# Patient Record
Sex: Male | Born: 2000 | Race: Black or African American | Hispanic: No | Marital: Single | Smoking: Never smoker
Health system: Southern US, Community
[De-identification: ages and names within clinical notes are randomized; demographics above are authoritative.]

---

## 2019-01-08 ENCOUNTER — Emergency Department (HOSPITAL_COMMUNITY): Payer: BLUE CROSS/BLUE SHIELD

## 2019-01-08 ENCOUNTER — Emergency Department (HOSPITAL_COMMUNITY)
Admission: EM | Admit: 2019-01-08 | Discharge: 2019-01-08 | Disposition: A | Discharge status: Home / Self Care | Payer: BLUE CROSS/BLUE SHIELD | Attending: Emergency Medicine | Admitting: Emergency Medicine

## 2019-01-08 ENCOUNTER — Other Ambulatory Visit: Payer: Self-pay

## 2019-01-08 ENCOUNTER — Encounter (HOSPITAL_COMMUNITY): Payer: Self-pay | Admitting: Emergency Medicine

## 2019-01-08 DIAGNOSIS — R0602 Shortness of breath: Secondary | ICD-10-CM | POA: Diagnosis not present

## 2019-01-08 MED ORDER — ALBUTEROL SULFATE HFA 108 (90 BASE) MCG/ACT IN AERS
4.0000 | INHALATION_SPRAY | Freq: Once | RESPIRATORY_TRACT | Status: AC
  Administered 2019-01-08: 4 via RESPIRATORY_TRACT
  Filled 2019-01-08: qty 6.7

## 2019-01-08 MED ORDER — AEROCHAMBER PLUS FLO-VU MEDIUM MISC
1.0000 | Freq: Once | Status: DC
  Filled 2019-01-08: qty 1

## 2019-01-08 NOTE — ED Notes (Signed)
Pt given DC instructions and isolation precautions for 14 days. Pt verbalized understanding. Pt states breathing is better with inhaler. Pt advised to come back for increased SOB

## 2019-01-08 NOTE — ED Provider Notes (Signed)
[facility] EMERGENCY DEPARTMENT Provider Note   CSN: [id number] Arrival date & time: 01/08/19  1940    History   Chief Complaint Chief Complaint  Patient presents with  . Chest Pain  . Shortness of Breath    HPI Dustin Schultz is a 18 y.o. male with a past medical history of asthma, seasonal allergies, who presents today for evaluation of shortness of breath.  He reports that he was at work today when he developed sudden shortness of breath.  He reports generally feeling like his chest is tight.  He does report that someone else at work has reportedly tested positive for corona virus.  He reports that he works at [employer] in [place] [employer].  All of his symptoms started suddenly today.  He denies any hemoptysis, no hormone use leg swelling, or history of PE/DVT.  He reports sore throat, however that is been present for multiple weeks and he attributes that to the pollen.     HPI  History reviewed. No pertinent past medical history.  There are no active problems to display for this patient.   History reviewed. No pertinent surgical history.      Home Medications    Prior to Admission medications   Not on File    Family History No family history on file.  Social History Social History   Tobacco Use  . Smoking status: Not on file  Substance Use Topics  . Alcohol use: Not on file  . Drug use: Not on file     Allergies   Patient has no known allergies.   Review of Systems Review of Systems  Constitutional: Negative for chills and fever.  HENT: Positive for congestion, postnasal drip, sinus pressure and sore throat.   Eyes: Positive for redness.  Respiratory: Positive for chest tightness and shortness of breath. Negative for cough.   Gastrointestinal: Negative for abdominal pain, diarrhea, nausea and vomiting.  Genitourinary: Negative for dysuria.  Musculoskeletal: Negative for back pain and neck pain.  Neurological: Negative for weakness and  headaches.  All other systems reviewed and are negative.    Physical Exam Updated Vital Signs BP 139/85   Pulse (!) 103   Temp 99.1 F (37.3 C) (Oral)   Resp 20   Ht 6\' 1"  (1.854 m)   Wt 99.8 kg   SpO2 97%   BMI 29.03 kg/m   Physical Exam Vitals signs and nursing note reviewed.  Constitutional:      Appearance: He is well-developed.  HENT:     Head: Normocephalic and atraumatic.  Eyes:     Conjunctiva/sclera: Conjunctivae normal.  Neck:     Musculoskeletal: Neck supple.  Cardiovascular:     Rate and Rhythm: Normal rate and regular rhythm.     Heart sounds: Normal heart sounds. No murmur.  Pulmonary:     Effort: Pulmonary effort is normal. No respiratory distress.     Breath sounds: Examination of the right-upper field reveals wheezing. Examination of the left-upper field reveals wheezing. Examination of the right-middle field reveals wheezing. Examination of the left-middle field reveals wheezing. Examination of the right-lower field reveals wheezing. Examination of the left-lower field reveals wheezing. Wheezing present.  Abdominal:     Palpations: Abdomen is soft.     Tenderness: There is no abdominal tenderness.  Skin:    General: Skin is warm and dry.  Neurological:     General: No focal deficit present.     Mental Status: He is alert.  Psychiatric:  Mood and Affect: Mood normal.        Behavior: Behavior normal.      ED Treatments / Results  Labs (all labs ordered are listed, but only abnormal results are displayed) Labs Reviewed - No data to display  EKG EKG Interpretation  Date/Time:  Monday January 08 2019 20:11:30 EDT Ventricular Rate:  113 PR Interval:    QRS Duration: 88 QT Interval:  302 QTC Calculation: 414 R Axis:   64 Text Interpretation:  Sinus tachycardia Borderline Q waves in lateral leads ST elev, probable normal early repol pattern No previous ECGs available Confirmed by Alvira Monday ([id number]) on 01/08/2019 8:20:21 PM    Radiology Dg Chest Port 1 View  Result Date: 01/08/2019 CLINICAL DATA:  18 year old male with shortness of breath and tachycardia. EXAM: PORTABLE CHEST 1 VIEW COMPARISON:  None. FINDINGS: The heart size and mediastinal contours are within normal limits. Both lungs are clear. The visualized skeletal structures are unremarkable. IMPRESSION: No active disease. Electronically Signed   By: Elgie Collard M.D.   On: 01/08/2019 20:40    Procedures Procedures (including critical care time)  Medications Ordered in ED Medications  AeroChamber Plus Flo-Vu Medium MISC 1 each (has no administration in time range)  albuterol (PROVENTIL HFA;VENTOLIN HFA) 108 (90 Base) MCG/ACT inhaler 4 puff (4 puffs Inhalation Given 01/08/19 2105)     Initial Impression / Assessment and Plan / ED Course  I have reviewed the triage vital signs and the nursing notes.  Pertinent labs & imaging results that were available during my care of the patient were reviewed by me and considered in my medical decision making (see chart for details).  Clinical Course as of Jan 07 2210  Mon Jan 08, 2019  2146 Patient re-evaluated, he reports his breathing is better.     [EH]    Clinical Course User Index [EH] Cristina Gong, PA-C      Patient presents today for evaluation of shortness of breath.  He does have a known contact who has had coronavirus at work.  He has not had any fevers.  He feels like this may be allergy related.  On arrival here he was tachycardic with a temperature of 99.0.  He is satting at 97% on room air.  His lung sounds were wheezy bilaterally.  He was treated with albuterol MDI after which he reported improvement in his breathing.  Chest x-ray was obtained without evidence of acute infiltrates or abnormalities.  EKG does not show evidence of acute ischemia.  Given that his symptoms improved with albuterol I suspect that this may be more asthma/allergy related however given that he has shortness of  breath with a known coronavirus contact he is placed on droplet and contact precautions.  He is instructed to quarantine himself at home.  He states his understanding of this and the quarantine instructions and that he will abide by them.  He does not have any PE risk factors, does not take any hormones, does not have any history of PE or DVT, no leg swelling, hemoptysis, or recent prolonged travel.    Return precautions were discussed with patient who states their understanding.  At the time of discharge patient denied any unaddressed complaints or concerns.  Patient is agreeable for discharge home.   Final Clinical Impressions(s) / ED Diagnoses   Final diagnoses:  Shortness of breath    ED Discharge Orders    None       Cristina Gong, [place] 01/08/19 2239  Alvira Monday, MD 01/09/19 [phone]

## 2019-01-08 NOTE — Discharge Instructions (Addendum)
To use your inhaler take two puffs every 4 hours as needed.  You may use it every hour if needed.  If this does not help, your symptoms worsen or your lips or finger nails turn blue please seek additional medical care.  You must quarentine your self at home.  You must remain quarantined for a minimum of 7 days.  After that you may stop quarantining once you have not had any symptoms and have not had fevers for at least 72 hours.  If you are able to stay quarantined for 2 weeks that would be ideal.  I would also recommend that you use Flonase another nasal steroid spray, in addition to taking either Allegra, Claritin, or Zyrtec to help with your allergy symptoms.   Person Under Monitoring Name: Dustin Schultz  Location: [address] [place] [address]   Infection Prevention Recommendations for Individuals Confirmed to have, or Being Evaluated for, 2019 Novel Coronavirus (COVID-19) Infection Who Receive Care at Home  Individuals who are confirmed to have, or are being evaluated for, COVID-19 should follow the prevention steps below until a healthcare provider or local or state health department says they can return to normal activities.  Stay home except to get medical care You should restrict activities outside your home, except for getting medical care. Do not go to work, school, or public areas, and do not use public transportation or taxis.  Call ahead before visiting your doctor Before your medical appointment, call the healthcare provider and tell them that you have, or are being evaluated for, COVID-19 infection. This will help the healthcare providers office take steps to keep other people from getting infected. Ask your healthcare provider to call the local or state health department.  Monitor your symptoms Seek prompt medical attention if your illness is worsening (e.g., difficulty breathing). Before going to your medical appointment, call the healthcare provider and tell  them that you have, or are being evaluated for, COVID-19 infection. Ask your healthcare provider to call the local or state health department.  Wear a facemask You should wear a facemask that covers your nose and mouth when you are in the same room with other people and when you visit a healthcare provider. People who live with or visit you should also wear a facemask while they are in the same room with you.  Separate yourself from other people in your home As much as possible, you should stay in a different room from other people in your home. Also, you should use a separate bathroom, if available.  Avoid sharing household items You should not share dishes, drinking glasses, cups, eating utensils, towels, bedding, or other items with other people in your home. After using these items, you should wash them thoroughly with soap and water.  Cover your coughs and sneezes Cover your mouth and nose with a tissue when you cough or sneeze, or you can cough or sneeze into your sleeve. Throw used tissues in a lined trash can, and immediately wash your hands with soap and water for at least 20 seconds or use an alcohol-based hand rub.  Wash your [employer] your hands often and thoroughly with soap and water for at least 20 seconds. You can use an alcohol-based hand sanitizer if soap and water are not available and if your hands are not visibly dirty. Avoid touching your eyes, nose, and mouth with unwashed hands.   Prevention Steps for Caregivers and Household Members of Individuals Confirmed to have, or  Being Evaluated for, COVID-19 Infection Being Cared for in the Home  If you live with, or provide care at home for, a person confirmed to have, or being evaluated for, COVID-19 infection please follow these guidelines to prevent infection:  Follow healthcare providers instructions Make sure that you understand and can help the patient follow any healthcare provider instructions for all  care.  Provide for the patients basic needs You should help the patient with basic needs in the home and provide support for getting groceries, prescriptions, and other personal needs.  Monitor the patients symptoms If they are getting sicker, call his or her medical provider and tell them that the patient has, or is being evaluated for, COVID-19 infection. This will help the healthcare providers office take steps to keep other people from getting infected. Ask the healthcare provider to call the local or state health department.  Limit the number of people who have contact with the patient If possible, have only one caregiver for the patient. Other household members should stay in another home or place of residence. If this is not possible, they should stay in another room, or be separated from the patient as much as possible. Use a separate bathroom, if available. Restrict visitors who do not have an essential need to be in the home.  Keep older adults, very young children, and other sick people away from the patient Keep older adults, very young children, and those who have compromised immune systems or chronic health conditions away from the patient. This includes people with chronic heart, lung, or kidney conditions, diabetes, and cancer.  Ensure good ventilation Make sure that shared spaces in the home have good air flow, such as from an air conditioner or an opened window, weather permitting.  Wash your hands often Wash your hands often and thoroughly with soap and water for at least 20 seconds. You can use an alcohol based hand sanitizer if soap and water are not available and if your hands are not visibly dirty. Avoid touching your eyes, nose, and mouth with unwashed hands. Use disposable paper towels to dry your hands. If not available, use dedicated cloth towels and replace them when they become wet.  Wear a facemask and gloves Wear a disposable facemask at all times in  the room and gloves when you touch or have contact with the patients blood, body fluids, and/or secretions or excretions, such as sweat, saliva, sputum, nasal mucus, vomit, urine, or feces.  Ensure the mask fits over your nose and mouth tightly, and do not touch it during use. Throw out disposable facemasks and gloves after using them. Do not reuse. Wash your hands immediately after removing your facemask and gloves. If your personal clothing becomes contaminated, carefully remove clothing and launder. Wash your hands after handling contaminated clothing. Place all used disposable facemasks, gloves, and other waste in a lined container before disposing them with other household waste. Remove gloves and wash your hands immediately after handling these items.  Do not share dishes, glasses, or other household items with the patient Avoid sharing household items. You should not share dishes, drinking glasses, cups, eating utensils, towels, bedding, or other items with a patient who is confirmed to have, or being evaluated for, COVID-19 infection. After the person uses these items, you should wash them thoroughly with soap and water.  Wash laundry thoroughly Immediately remove and wash clothes or bedding that have blood, body fluids, and/or secretions or excretions, such as sweat, saliva, sputum, nasal mucus,  vomit, urine, or feces, on them. Wear gloves when handling laundry from the patient. Read and follow directions on labels of laundry or clothing items and detergent. In general, wash and dry with the warmest temperatures recommended on the label.  Clean all areas the individual has used often Clean all touchable surfaces, such as counters, tabletops, doorknobs, bathroom fixtures, toilets, phones, keyboards, tablets, and bedside tables, every day. Also, clean any surfaces that may have blood, body fluids, and/or secretions or excretions on them. Wear gloves when cleaning surfaces the patient has  come in contact with. Use a diluted bleach solution (e.g., dilute bleach with 1 part bleach and 10 parts water) or a household disinfectant with a label that says EPA-registered for coronaviruses. To make a bleach solution at home, add 1 tablespoon of bleach to 1 quart (4 cups) of water. For a larger supply, add  cup of bleach to 1 gallon (16 cups) of water. Read labels of cleaning products and follow recommendations provided on product labels. Labels contain instructions for safe and effective use of the cleaning product including precautions you should take when applying the product, such as wearing gloves or eye protection and making sure you have good ventilation during use of the product. Remove gloves and wash hands immediately after cleaning.  Monitor yourself for signs and symptoms of illness Caregivers and household members are considered close contacts, should monitor their health, and will be asked to limit movement outside of the home to the extent possible. Follow the monitoring steps for close contacts listed on the symptom monitoring form.   ? If you have additional questions, contact your local health department or call the epidemiologist on call at [phone] (available 24/7). ? This guidance is subject to change. For the most up-to-date guidance from [facility], please refer to their website: [employer]

## 2019-01-08 NOTE — ED Triage Notes (Signed)
Pt states he has been SOB today and having congestion. States he thought it was allergies but SOB has gotten worse. Denies travel. Works at [employer] in [place] [employer]. Hx of asthma

## 2022-03-02 ENCOUNTER — Emergency Department (HOSPITAL_COMMUNITY): Payer: BLUE CROSS/BLUE SHIELD | Admitting: Anesthesiology

## 2022-03-02 ENCOUNTER — Ambulatory Visit (HOSPITAL_COMMUNITY)
Admission: EM | Admit: 2022-03-02 | Discharge: 2022-03-03 | Disposition: A | Discharge status: Home / Self Care | Payer: BLUE CROSS/BLUE SHIELD | Attending: Student | Admitting: Student

## 2022-03-02 ENCOUNTER — Encounter (HOSPITAL_COMMUNITY): Payer: Self-pay | Admitting: Emergency Medicine

## 2022-03-02 ENCOUNTER — Other Ambulatory Visit: Payer: Self-pay

## 2022-03-02 ENCOUNTER — Emergency Department (HOSPITAL_COMMUNITY): Payer: BLUE CROSS/BLUE SHIELD

## 2022-03-02 ENCOUNTER — Encounter (HOSPITAL_COMMUNITY)
Admission: EM | Disposition: A | Discharge status: Home / Self Care | Payer: Self-pay | Source: Home / Self Care | Attending: Student

## 2022-03-02 DIAGNOSIS — X58XXXA Exposure to other specified factors, initial encounter: Secondary | ICD-10-CM | POA: Insufficient documentation

## 2022-03-02 DIAGNOSIS — S02651A Fracture of angle of right mandible, initial encounter for closed fracture: Secondary | ICD-10-CM

## 2022-03-02 DIAGNOSIS — Y9367 Activity, basketball: Secondary | ICD-10-CM | POA: Diagnosis not present

## 2022-03-02 HISTORY — PX: ORIF MANDIBULAR FRACTURE: SHX2127

## 2022-03-02 LAB — BASIC METABOLIC PANEL WITH GFR
Anion gap: 11 (ref 5–15)
BUN: 8 mg/dL (ref 6–20)
CO2: 26 mmol/L (ref 22–32)
Calcium: 9.8 mg/dL (ref 8.9–10.3)
Chloride: 103 mmol/L (ref 98–111)
Creatinine, Ser: 0.96 mg/dL (ref 0.61–1.24)
GFR, Estimated: >60 mL/min (ref >60)
Glucose, Bld: 77 mg/dL (ref 70–99)
Potassium: 4 mmol/L (ref 3.5–5.1)
Sodium: 140 mmol/L (ref 135–145)

## 2022-03-02 LAB — CBC WITH DIFFERENTIAL/PLATELET
Abs Immature Granulocytes: 0.01 10*3/uL (ref 0.00–0.07)
Basophils Absolute: 0 10*3/uL (ref 0.0–0.1)
Basophils Relative: 1 %
Eosinophils Absolute: 0.2 10*3/uL (ref 0.0–0.5)
Eosinophils Relative: 4 %
HCT: 46.8 % (ref 39.0–52.0)
Hemoglobin: 15.5 g/dL (ref 13.0–17.0)
Immature Granulocytes: 0 %
Lymphocytes Relative: 28 %
Lymphs Abs: 1.4 10*3/uL (ref 0.7–4.0)
MCH: 26.6 pg (ref 26.0–34.0)
MCHC: 33.1 g/dL (ref 30.0–36.0)
MCV: 80.4 fL (ref 80.0–100.0)
Monocytes Absolute: 0.5 10*3/uL (ref 0.1–1.0)
Monocytes Relative: 11 %
Neutro Abs: 2.8 10*3/uL (ref 1.7–7.7)
Neutrophils Relative %: 56 %
Platelets: 247 10*3/uL (ref 150–400)
RBC: 5.82 MIL/uL — ABNORMAL HIGH (ref 4.22–5.81)
RDW: 12.2 % (ref 11.5–15.5)
WBC: 5.1 10*3/uL (ref 4.0–10.5)
nRBC: 0 % (ref 0.0–0.2)

## 2022-03-02 SURGERY — OPEN REDUCTION INTERNAL FIXATION (ORIF) MANDIBULAR FRACTURE
Anesthesia: General | Site: Mouth

## 2022-03-02 MED ORDER — ACETAMINOPHEN 10 MG/ML IV SOLN
INTRAVENOUS | Status: AC
Start: 2022-03-02 — End: ?
  Filled 2022-03-02: qty 100

## 2022-03-02 MED ORDER — MIDAZOLAM HCL 5 MG/5ML IJ SOLN
INTRAMUSCULAR | Status: DC | PRN
  Administered 2022-03-02: 2 mg via INTRAVENOUS

## 2022-03-02 MED ORDER — ONDANSETRON HCL 4 MG/2ML IJ SOLN
4.0000 mg | Freq: Once | INTRAMUSCULAR | Status: AC
  Administered 2022-03-02: 4 mg via INTRAVENOUS
  Filled 2022-03-02: qty 2

## 2022-03-02 MED ORDER — DEXAMETHASONE SODIUM PHOSPHATE 4 MG/ML IJ SOLN
INTRAMUSCULAR | Status: DC | PRN
  Administered 2022-03-02: 10 mg via INTRAVENOUS

## 2022-03-02 MED ORDER — CEFAZOLIN SODIUM-DEXTROSE 2-4 GM/100ML-% IV SOLN
INTRAVENOUS | Status: AC
  Filled 2022-03-02: qty 100

## 2022-03-02 MED ORDER — CEFAZOLIN SODIUM-DEXTROSE 2-3 GM-%(50ML) IV SOLR
INTRAVENOUS | Status: DC | PRN
Start: 2022-03-02 — End: 2022-03-03
  Administered 2022-03-02: 2 g via INTRAVENOUS

## 2022-03-02 MED ORDER — LIDOCAINE-EPINEPHRINE 1 %-1:100000 IJ SOLN
INTRAMUSCULAR | Status: AC
  Filled 2022-03-02: qty 1

## 2022-03-02 MED ORDER — LIDOCAINE-EPINEPHRINE 1 %-1:100000 IJ SOLN
INTRAMUSCULAR | Status: DC | PRN
  Administered 2022-03-02: 3 mL

## 2022-03-02 MED ORDER — 0.9 % SODIUM CHLORIDE (POUR BTL) OPTIME
TOPICAL | Status: DC | PRN
Start: 2022-03-02 — End: 2022-03-03
  Administered 2022-03-02: 1000 mL

## 2022-03-02 MED ORDER — FENTANYL CITRATE (PF) 250 MCG/5ML IJ SOLN
INTRAMUSCULAR | Status: AC
  Filled 2022-03-02: qty 5

## 2022-03-02 MED ORDER — ACETAMINOPHEN 10 MG/ML IV SOLN
INTRAVENOUS | Status: DC | PRN
  Administered 2022-03-02: 1000 mg via INTRAVENOUS

## 2022-03-02 MED ORDER — ONDANSETRON HCL 4 MG/2ML IJ SOLN
INTRAMUSCULAR | Status: DC | PRN
  Administered 2022-03-02: 4 mg via INTRAVENOUS

## 2022-03-02 MED ORDER — ARTIFICIAL TEARS OPHTHALMIC OINT
TOPICAL_OINTMENT | OPHTHALMIC | Status: DC | PRN
  Administered 2022-03-02: 1 via OPHTHALMIC

## 2022-03-02 MED ORDER — FENTANYL CITRATE (PF) 250 MCG/5ML IJ SOLN
INTRAMUSCULAR | Status: DC | PRN
  Administered 2022-03-02 (×3): 50 ug via INTRAVENOUS
  Administered 2022-03-02: 100 ug via INTRAVENOUS

## 2022-03-02 MED ORDER — ROCURONIUM BROMIDE 100 MG/10ML IV SOLN
INTRAVENOUS | Status: DC | PRN
  Administered 2022-03-02: 80 mg via INTRAVENOUS

## 2022-03-02 MED ORDER — MIDAZOLAM HCL 2 MG/2ML IJ SOLN
INTRAMUSCULAR | Status: AC
  Filled 2022-03-02: qty 2

## 2022-03-02 MED ORDER — OXYMETAZOLINE HCL 0.05 % NA SOLN
NASAL | Status: AC
  Filled 2022-03-02: qty 30

## 2022-03-02 MED ORDER — LACTATED RINGERS IV SOLN: INTRAVENOUS | Status: DC | PRN

## 2022-03-02 MED ORDER — LIDOCAINE HCL (CARDIAC) PF 100 MG/5ML IV SOSY
PREFILLED_SYRINGE | INTRAVENOUS | Status: DC | PRN
  Administered 2022-03-02: 40 mg via INTRATRACHEAL

## 2022-03-02 MED ORDER — PROPOFOL 10 MG/ML IV BOLUS
INTRAVENOUS | Status: AC
  Filled 2022-03-02: qty 20

## 2022-03-02 MED ORDER — OXYMETAZOLINE HCL 0.05 % NA SOLN
NASAL | Status: DC | PRN
  Administered 2022-03-02: 1 via NASAL

## 2022-03-02 MED ORDER — PROPOFOL 10 MG/ML IV BOLUS
INTRAVENOUS | Status: DC | PRN
  Administered 2022-03-02: 200 mg via INTRAVENOUS

## 2022-03-02 MED ORDER — SODIUM CHLORIDE 0.9 % IV BOLUS
1000.0000 mL | Freq: Once | INTRAVENOUS | Status: AC
  Administered 2022-03-02: 1000 mL via INTRAVENOUS

## 2022-03-02 MED ORDER — MORPHINE SULFATE (PF) 4 MG/ML IV SOLN
4.0000 mg | Freq: Once | INTRAVENOUS | Status: AC
  Administered 2022-03-02: 4 mg via INTRAVENOUS
  Filled 2022-03-02: qty 1

## 2022-03-02 SURGICAL SUPPLY — 45 items
BAG COUNTER SPONGE SURGICOUNT (BAG) ×1 IMPLANT
BAR FIX PREFORMED OMNIMAX (Miscellaneous) ×2 IMPLANT
BLADE SURG 10 STRL SS (BLADE) IMPLANT
BLADE SURG 15 STRL LF DISP TIS (BLADE) IMPLANT
BLADE SURG 15 STRL SS (BLADE)
CANISTER SUCT 3000ML PPV (MISCELLANEOUS) ×2 IMPLANT
CLEANER TIP ELECTROSURG 2X2 (MISCELLANEOUS) ×2 IMPLANT
COVER SURGICAL LIGHT HANDLE (MISCELLANEOUS) ×3 IMPLANT
DECANTER SPIKE VIAL GLASS SM (MISCELLANEOUS) ×1 IMPLANT
DRAPE HALF SHEET 40X57 (DRAPES) IMPLANT
ELECT COATED BLADE 2.86 ST (ELECTRODE) IMPLANT
ELECT NDL BLADE 2-5/6 (NEEDLE) IMPLANT
ELECT NEEDLE BLADE 2-5/6 (NEEDLE) IMPLANT
ELECT REM PT RETURN 9FT ADLT (ELECTROSURGICAL) ×2
ELECTRODE REM PT RTRN 9FT ADLT (ELECTROSURGICAL) ×1 IMPLANT
GLOVE ECLIPSE 7.5 STRL STRAW (GLOVE) ×2 IMPLANT
GOWN STRL REUS W/ TWL LRG LVL3 (GOWN DISPOSABLE) ×2 IMPLANT
GOWN STRL REUS W/TWL LRG LVL3 (GOWN DISPOSABLE) ×2
KIT BASIN OR (CUSTOM PROCEDURE TRAY) ×2 IMPLANT
KIT TURNOVER KIT B (KITS) ×2 IMPLANT
NDL HYPO 25GX1X1/2 BEV (NEEDLE) IMPLANT
NEEDLE HYPO 25GX1X1/2 BEV (NEEDLE) IMPLANT
NS IRRIG 1000ML POUR BTL (IV SOLUTION) ×2 IMPLANT
PAD ARMBOARD 7.5X6 YLW CONV (MISCELLANEOUS) ×4 IMPLANT
PATTIES SURGICAL .5 X3 (DISPOSABLE) IMPLANT
PENCIL FOOT CONTROL (ELECTRODE) ×2 IMPLANT
PLATE LOCK STRT 4H 1/SCREW (Plate) ×1 IMPLANT
POSITIONER HEAD DONUT 9IN (MISCELLANEOUS) IMPLANT
PROTECTOR CORNEAL (OPHTHALMIC RELATED) IMPLANT
SCISSORS WIRE ANG 4 3/4 DISP (INSTRUMENTS) IMPLANT
SCREW BONE 2X7 CROSS DRIVE (Screw) ×5 IMPLANT
SCREW BONE MANDIB SD 2X9 (Screw) ×5 IMPLANT
SCREW NON LOCK X-DR 2.0X5 (Screw) ×4 IMPLANT
SUT CHROMIC 3 0 PS 2 (SUTURE) ×2 IMPLANT
SUT CHROMIC 4 0 PS 2 18 (SUTURE) ×1 IMPLANT
SUT ETHILON 5 0 P 3 18 (SUTURE) ×1
SUT NYLON ETHILON 5-0 P-3 1X18 (SUTURE) ×1 IMPLANT
SUT SILK 2 0 PERMA HAND 18 BK (SUTURE) IMPLANT
SUT STEEL 0 (SUTURE)
SUT STEEL 0 18XMFL TIE 17 (SUTURE) IMPLANT
SUT STEEL 4 (SUTURE) ×1 IMPLANT
TOWEL GREEN STERILE FF (TOWEL DISPOSABLE) ×2 IMPLANT
TRAY ENT MC OR (CUSTOM PROCEDURE TRAY) ×2 IMPLANT
WATER STERILE IRR 1000ML POUR (IV SOLUTION) ×1 IMPLANT
WIRE 24 GAUGE OMINIMAX MMF (WIRE) ×5 IMPLANT

## 2022-03-02 NOTE — ED Provider Triage Note (Signed)
Emergency Medicine Provider Triage Evaluation Note  Dustin Schultz , a 21 y.o. male  was evaluated in triage.  Pt complains of mandible fracture.  Sustained a mandible fracture while playing basketball on 02/27/2022.  Went to the urgent care was diagnosed with this fracture and then saw the dentist yesterday.  Dentist referred him to the ER for treatment of a mandible fracture.  States that his pain has been controlled with pain medications.  Denies any other injuries  Review of Systems  Positive: Right jaw pain Negative: Fever  Physical Exam  BP 129/84 (BP Location: Left Arm)   Pulse 70   Temp 98.7 F (37.1 C) (Oral)   Resp 18   SpO2 99%  Gen:   Awake, no distress   Resp:  Normal effort  MSK:   Moves extremities without difficulty  Other:  No swelling noted of the face  Medical Decision Making  Medically screening exam initiated at 12:18 PM.  Appropriate orders placed.  Seamus Ivie was informed that the remainder of the evaluation will be completed by another provider, this initial triage assessment does not replace that evaluation, and the importance of remaining in the ED until their evaluation is complete.     Delia Heady, PA-C 03/02/22 1219

## 2022-03-02 NOTE — ED Notes (Signed)
Pt placed back into system. Pt did not hear name.

## 2022-03-02 NOTE — ED Provider Notes (Signed)
[facility] EMERGENCY DEPARTMENT Provider Note   CSN: [id number] Arrival date & time: 03/02/22  1115     History  Chief Complaint  Patient presents with   Jaw Pain    Dustin Schultz is a 21 y.o. male no chronic medical problems here for evaluation of jaw pain.  He was playing basketball about 4 days ago when he was elbowed to the right jaw.  He felt immediate pain since.  He was seen by urgent care and told to follow-up with dentistry.  Was seen by dentistry in [place].  Had x-ray which showed possible mandible fracture.  He was sent here for intervention.  Patient states he has not been able to eat any significant amounts since him onset.  Had noted some right-sided facial swelling.  No fever, redness or warmth.  He has never had anything like this previously.  Taking Tylenol at home without relief.  No loose dentition.  No intraoral lacerations, abrasions.  Denies headache, LOC or anticoagulation  HPI     Home Medications Prior to Admission medications   Not on File      Allergies    Patient has no known allergies.    Review of Systems   Review of Systems  Constitutional: Negative.   HENT:  Positive for facial swelling.        Right jaw pain  Respiratory: Negative.    Cardiovascular: Negative.   Gastrointestinal: Negative.   Genitourinary: Negative.   Musculoskeletal: Negative.   Skin: Negative.   Neurological: Negative.   All other systems reviewed and are negative.  Physical Exam Updated Vital Signs BP (!) 147/83   Pulse 64   Temp 98.6 F (37 C) (Oral)   Resp 20   SpO2 100%  Physical Exam Vitals and nursing note reviewed.  Constitutional:      General: He is not in acute distress.    Appearance: He is well-developed. He is not ill-appearing, toxic-appearing or diaphoretic.  HENT:     Head: Normocephalic.     Jaw: Tenderness and pain on movement present. No swelling or malocclusion.      Comments: Right-sided jaw tenderness at mandibular  angle with some mild overlying soft tissue swelling.  No erythema, warmth.  No submandibular swelling.  3 finger opening    Mouth/Throat:     Lips: Pink.     Mouth: Mucous membranes are moist. Injury present.     Tongue: No lesions. Tongue does not deviate from midline.     Pharynx: Oropharynx is clear. Uvula midline.     Tonsils: No tonsillar exudate or tonsillar abscesses.     Comments: Tongue midline, no sublingual swelling, no loose dentition.  No intraoral lacerations. No racoon eyes, battle sign Eyes:     Pupils: Pupils are equal, round, and reactive to light.     Comments: PERRLA  Neck:     Trachea: Trachea and phonation normal.     Comments: No neck stiffness or neck rigidity Cardiovascular:     Rate and Rhythm: Normal rate and regular rhythm.  Pulmonary:     Effort: Pulmonary effort is normal. No respiratory distress.  Abdominal:     General: There is no distension.     Palpations: Abdomen is soft.  Musculoskeletal:        General: Normal range of motion.     Cervical back: Full passive range of motion without pain, normal range of motion and neck supple.  Lymphadenopathy:     Cervical: No  cervical adenopathy.  Skin:    General: Skin is warm and dry.  Neurological:     General: No focal deficit present.     Mental Status: He is alert and oriented to person, place, and time.    ED Results / Procedures / Treatments   Labs (all labs ordered are listed, but only abnormal results are displayed) Labs Reviewed  CBC WITH DIFFERENTIAL/PLATELET - Abnormal; Notable for the following components:      Result Value   RBC 5.82 (*)    All other components within normal limits  BASIC METABOLIC PANEL    EKG None  Radiology CT Maxillofacial Wo Contrast  Result Date: 03/02/2022 CLINICAL DATA:  Facial trauma, blunt right jaw pain x 4 days after injury. EXAM: CT MAXILLOFACIAL WITHOUT CONTRAST TECHNIQUE: Multidetector CT imaging of the maxillofacial structures was performed.  Multiplanar CT image reconstructions were also generated. RADIATION DOSE REDUCTION: This exam was performed according to the departmental dose-optimization program which includes automated exposure control, adjustment of the mA and/or kV according to patient size and/or use of iterative reconstruction technique. COMPARISON:  None Available. FINDINGS: Osseous: There is an acute minimally displaced fracture of the angle of the right mandible distal to the mandibular foramen with the fracture plane extending through the inferior alveolar canal, best seen on axial image # 25/4 and sagittal image # 28/8, as well ex extending through the root of tooth # 31. Fracture fragments are in anatomic alignment. No other fracture identified. No mandibular dislocation. Orbits: Negative. No traumatic or inflammatory finding. Sinuses: Clear. Soft tissues: There is soft tissue swelling superficial to the body of the right mandible. Limited intracranial: No significant or unexpected finding. IMPRESSION: Minimally displaced anatomic aligned fracture of the angle of the right mandible distal to the mandibular foramen with the fracture plane traversing the inferior alveolar canal and the root of tooth # 31. No superimposed mandibular dislocation. Mild overlying soft tissue swelling. Electronically Signed   By: Helyn Numbers M.D.   On: 03/02/2022 21:26    Procedures Procedures    Medications Ordered in ED Medications  morphine (PF) 4 MG/ML injection 4 mg (4 mg Intravenous Given 03/02/22 2009)  ondansetron (ZOFRAN) injection 4 mg (4 mg Intravenous Given 03/02/22 2005)  sodium chloride 0.9 % bolus 1,000 mL (0 mLs Intravenous Stopped 03/02/22 2223)   ED Course/ Medical Decision Making/ A&P    Otherwise healthy 21 year old here for evaluation of right jaw pain after being elbowed in the face while playing basketball few days ago.  Initially seen at urgent care told follow-up with dentistry.  Was seen by dentistry today who obtained  an x-ray which showed possible right mandible fracture.  He has no loose dentition on exam.  No evidence of intraoral lacerations.  Does have some tenderness to right mandibular angle and overlying soft tissue swelling.  Sublingual and submandibular area soft, low suspicion for infectious process such as Ludewig's angina, deep space infection.  Suspect fracture.  Will obtain CT imaging here  Labs and imaging personally viewed and interpreted: CBC without leukocytosis, hemoglobin 15.5 Metabolic panel without significant abnormality CT max face with right mandibular angle fracture extending into the root of tooth #31  Patient reassessed.  Pain controlled.  Discussed labs and imaging with patient.  Will consult with ENT for further evaluation  CONSULT with Dr. Jearld Fenton with ENT who will review imaging and assess patient.  Prior to has assessed patient at bedside.  Patient to go to the OR with ENT  The patient appears reasonably stabilized for admission considering the current resources, flow, and capabilities available in the ED at this time, and [facility] doubt [facility] further screening and/or treatment in the ED prior to admission.                            Medical Decision Making Amount and/or Complexity of Data Reviewed External Data Reviewed: radiology and notes.    Details: Reviewed notes, imaging patient brought with him from dentistry Labs: ordered. Decision-making details documented in ED Course. Radiology: ordered and independent interpretation performed. Decision-making details documented in ED Course.  Risk OTC drugs. Prescription drug management. Parenteral controlled substances. Decision regarding hospitalization. Diagnosis or treatment significantly limited by social determinants of health.          Final Clinical Impression(s) / ED Diagnoses Final diagnoses:  Closed fracture of right mandibular angle, initial encounter [facility](HCC)    Rx / DC Orders ED  Discharge Orders     None         Eris Breck A, PA-C 03/02/22 2226    Glendora ScoreKommor, [place], MD 03/03/22 1610
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
## 2022-03-02 NOTE — ED Notes (Signed)
OR consent at bedside, pt removing clothing at this time
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
## 2022-03-02 NOTE — ED Notes (Signed)
NA x3

## 2022-03-02 NOTE — Anesthesia Procedure Notes (Signed)
Procedure Name: Intubation Date/Time: 03/02/2022 11:34 PM Performed by: Edmonia Caprio, CRNA Pre-anesthesia Checklist: Patient identified, Emergency Drugs available, Suction available, Patient being monitored and Timeout performed Patient Re-evaluated:Patient Re-evaluated prior to induction Oxygen Delivery Method: Circle system utilized Preoxygenation: Pre-oxygenation with 100% oxygen Induction Type: IV induction Ventilation: Mask ventilation without difficulty Laryngoscope Size: Miller and 2 Grade View: Grade I Nasal Tubes: Left and Nasal prep performed Tube size: 7.5 mm Number of attempts: 1 Placement Confirmation: ETT inserted through vocal cords under direct vision, breath sounds checked- equal and bilateral and positive ETCO2 Tube secured with: Tape Dental Injury: Teeth and Oropharynx as per pre-operative assessment  Comments: Minimal bleeding from nose into oropharynx after passing ETT through left nare.  Passed thru cords under DL with Hyacinth Meeker 2 without need for McGill's.

## 2022-03-02 NOTE — Anesthesia Preprocedure Evaluation (Signed)
Anesthesia Evaluation  Patient identified by MRN, date of birth, ID band Patient awake    History of Anesthesia Complications Negative for: history of anesthetic complications  Airway Mallampati: IV  TM Distance: >3 FB Neck ROM: Full  Mouth opening: Limited Mouth Opening  Dental  (+) Teeth Intact, Dental Advisory Given   Pulmonary neg pulmonary ROS,    breath sounds clear to auscultation       Cardiovascular negative cardio ROS   Rhythm:Regular     Neuro/Psych negative neurological ROS  negative psych ROS   GI/Hepatic negative GI ROS, Neg liver ROS,   Endo/Other  negative endocrine ROS  Renal/GU negative Renal ROS     Musculoskeletal negative musculoskeletal ROS (+)   Abdominal   Peds  Hematology negative hematology ROS (+)   Anesthesia Other Findings   Reproductive/Obstetrics                            Anesthesia Physical Anesthesia Plan  ASA: 1  Anesthesia Plan: General   Post-op Pain Management:    Induction: Intravenous  PONV Risk Score and Plan: 2 and Ondansetron and Dexamethasone  Airway Management Planned: Nasal ETT  Additional Equipment: None  Intra-op Plan:   Post-operative Plan: Extubation in OR  Informed Consent: I have reviewed the patients History and Physical, chart, labs and discussed the procedure including the risks, benefits and alternatives for the proposed anesthesia with the patient or authorized representative who has indicated his/her understanding and acceptance.     Dental advisory given  Plan Discussed with: CRNA  Anesthesia Plan Comments:         Anesthesia Quick Evaluation

## 2022-03-03 ENCOUNTER — Encounter (HOSPITAL_COMMUNITY): Payer: Self-pay | Admitting: Otolaryngology

## 2022-03-03 ENCOUNTER — Other Ambulatory Visit: Payer: Self-pay

## 2022-03-03 DIAGNOSIS — S02651A Fracture of angle of right mandible, initial encounter for closed fracture: Secondary | ICD-10-CM | POA: Diagnosis not present

## 2022-03-03 MED ORDER — ACETAMINOPHEN 160 MG/5ML PO SOLN: 1000.0000 mg | Freq: Once | ORAL | Status: DC | PRN

## 2022-03-03 MED ORDER — LIDOCAINE 2% (20 MG/ML) 5 ML SYRINGE
INTRAMUSCULAR | Status: AC
  Filled 2022-03-03: qty 5

## 2022-03-03 MED ORDER — ONDANSETRON HCL 4 MG/2ML IJ SOLN
INTRAMUSCULAR | Status: AC
  Filled 2022-03-03: qty 2

## 2022-03-03 MED ORDER — HYDROCODONE-ACETAMINOPHEN 7.5-325 MG/15ML PO SOLN: 15.0000 mL | Freq: Four times a day (QID) | ORAL | 0 refills | Status: AC | PRN

## 2022-03-03 MED ORDER — KETOROLAC TROMETHAMINE 30 MG/ML IJ SOLN
INTRAMUSCULAR | Status: AC
  Filled 2022-03-03: qty 1

## 2022-03-03 MED ORDER — KETOROLAC TROMETHAMINE 30 MG/ML IJ SOLN
INTRAMUSCULAR | Status: DC | PRN
  Administered 2022-03-03: 30 mg via INTRAVENOUS

## 2022-03-03 MED ORDER — OXYCODONE HCL 5 MG PO TABS: 5.0000 mg | ORAL_TABLET | Freq: Once | ORAL | Status: DC | PRN

## 2022-03-03 MED ORDER — ACETAMINOPHEN 500 MG PO TABS: 1000.0000 mg | ORAL_TABLET | Freq: Once | ORAL | Status: DC | PRN

## 2022-03-03 MED ORDER — ROCURONIUM BROMIDE 10 MG/ML (PF) SYRINGE
PREFILLED_SYRINGE | INTRAVENOUS | Status: AC
  Filled 2022-03-03: qty 10

## 2022-03-03 MED ORDER — OXYCODONE HCL 5 MG/5ML PO SOLN: 5.0000 mg | Freq: Once | ORAL | Status: DC | PRN

## 2022-03-03 MED ORDER — DEXAMETHASONE SODIUM PHOSPHATE 10 MG/ML IJ SOLN
INTRAMUSCULAR | Status: AC
  Filled 2022-03-03: qty 1

## 2022-03-03 MED ORDER — FENTANYL CITRATE (PF) 100 MCG/2ML IJ SOLN
INTRAMUSCULAR | Status: AC
  Filled 2022-03-03: qty 2

## 2022-03-03 MED ORDER — SUGAMMADEX SODIUM 200 MG/2ML IV SOLN
INTRAVENOUS | Status: DC | PRN
  Administered 2022-03-03: 200 mg via INTRAVENOUS

## 2022-03-03 MED ORDER — FENTANYL CITRATE (PF) 100 MCG/2ML IJ SOLN
25.0000 ug | INTRAMUSCULAR | Status: DC | PRN
  Administered 2022-03-03 (×2): 50 ug via INTRAVENOUS

## 2022-03-03 MED ORDER — ACETAMINOPHEN 10 MG/ML IV SOLN: 1000.0000 mg | Freq: Once | INTRAVENOUS | Status: DC | PRN
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
## 2022-03-03 NOTE — Transfer of Care (Signed)
Immediate Anesthesia Transfer of Care Note  Patient: Dustin Schultz  Procedure(s) Performed: OPEN REDUCTION INTERNAL FIXATION (ORIF) MANDIBULAR FRACTURE  Patient Location: PACU  Anesthesia Type:General  Level of Consciousness: awake and responds to stimulation  Airway & Oxygen Therapy: Patient Spontanous Breathing  Post-op Assessment: Report given to RN, Post -op Vital signs reviewed and stable and Patient moving all extremities X 4  Post vital signs: Reviewed and stable  Last Vitals:  Vitals Value Taken Time  BP 156/88 03/03/22 0047  Temp    Pulse 86 03/03/22 0049  Resp 12 03/03/22 0049  SpO2 98 % 03/03/22 0049  Vitals shown include unvalidated device data.  Last Pain:  Vitals:   03/02/22 2223  TempSrc:   PainSc: 5          Complications: No notable events documented.

## 2022-03-04 ENCOUNTER — Encounter (HOSPITAL_COMMUNITY): Payer: Self-pay | Admitting: Otolaryngology

## 2022-03-04 NOTE — Anesthesia Postprocedure Evaluation (Signed)
Anesthesia Post Note  Patient: Jaylee Lantry  Procedure(s) Performed: OPEN REDUCTION INTERNAL FIXATION (ORIF)  OF MANDIBLE FRACTURE WITH  MAXILLARY MANDIBULAR FIXATION (Mouth)     Patient location during evaluation: PACU Anesthesia Type: General Level of consciousness: awake and alert Pain management: pain level controlled Vital Signs Assessment: post-procedure vital signs reviewed and stable Respiratory status: spontaneous breathing, nonlabored ventilation, respiratory function stable and patient connected to nasal cannula oxygen Cardiovascular status: blood pressure returned to baseline and stable Postop Assessment: no apparent nausea or vomiting Anesthetic complications: no   No notable events documented.  Last Vitals:  Vitals:   03/03/22 0117 03/03/22 0132  BP: 91/60 (!) 141/80  Pulse: 60 61  Resp: 13 12  Temp:  36.6 C  SpO2: 96% 97%    Last Pain:  Vitals:   03/03/22 0132  TempSrc:   PainSc: Asleep                 Reeya Bound

## 2022-03-29 ENCOUNTER — Ambulatory Visit: Payer: BLUE CROSS/BLUE SHIELD | Admitting: Plastic Surgery

## 2022-03-29 ENCOUNTER — Ambulatory Visit (HOSPITAL_BASED_OUTPATIENT_CLINIC_OR_DEPARTMENT_OTHER): Payer: BLUE CROSS/BLUE SHIELD | Admitting: Otolaryngology

## 2022-03-29 ENCOUNTER — Encounter (INDEPENDENT_AMBULATORY_CARE_PROVIDER_SITE_OTHER): Payer: Self-pay | Admitting: Otolaryngology

## 2022-03-29 VITALS — BP 114/77 | HR 65 | Ht 72.0 in | Wt 213.8 lb

## 2022-03-29 DIAGNOSIS — S02600D Fracture of unspecified part of body of mandible, subsequent encounter for fracture with routine healing: Secondary | ICD-10-CM

## 2022-03-29 NOTE — Progress Notes (Signed)
Dustin Schultz is an 21 y.o. male.   Chief Complaint: mandible fracture HPI: hx of ORIF 5/24. He is doing great. No pain. Occlusion feels great. No swelling.  No past medical history on file.  Past Surgical History:  Procedure Laterality Date   ORIF MANDIBULAR FRACTURE N/A 03/02/2022   Procedure: OPEN REDUCTION INTERNAL FIXATION (ORIF)  OF MANDIBLE FRACTURE WITH  MAXILLARY MANDIBULAR FIXATION;  Surgeon: Suzanna Obey, MD;  Location: [facility] OR;  Service: ENT;  Laterality: N/A;    No family history on file. Social History:  reports that he has never smoked. He has never used smokeless tobacco. He reports that he does not currently use alcohol. He reports current drug use.  Allergies: No Known Allergies  (Not in a hospital admission)   No results found for this or any previous visit (from the past 48 hour(s)). No results found.   Blood pressure 114/77, pulse 65, height 6' (1.829 m), weight 97 kg, SpO2 (!) 80 %.  PHYSICAL EXAM: Appearance _ awake alert with no distress.  Head- atraumatic and no obvious abnormalities Eyes- PERRL, EOMI, no conjunctiva injection or ecchymosis Ears-  Right- Pinna without inflammation or swelling. canal without obstruction or injury. TM within normal limits  Left- Pinna without inflammation or swelling. canal without obstruction or injury. TM within normal limits Oc/OP- no lesions or excessive swelling. Wires cut and rubber bands placed. He has good occlusion. All wounds healed. No palpable pain.  Hp/Larynx- normal voice and no airway issues. No swelling or lesions Neck- no mass or lesions. Normal movement.  Neuro- CNII-XII intact, no sensory deficits.  Lungs- normal effort no distress noted     Assessment/Plan Mandible fracture- he is doing great and wires were cut and rubber bands placed. He will come back Friday for arch bars removal if no pain.   Suzanna Obey 03/29/2022,

## 2022-04-02 ENCOUNTER — Ambulatory Visit (INDEPENDENT_AMBULATORY_CARE_PROVIDER_SITE_OTHER): Payer: BLUE CROSS/BLUE SHIELD | Admitting: Otolaryngology

## 2022-04-02 DIAGNOSIS — S02651D Fracture of angle of right mandible, subsequent encounter for fracture with routine healing: Secondary | ICD-10-CM

## 2022-04-02 DIAGNOSIS — S02600D Fracture of unspecified part of body of mandible, subsequent encounter for fracture with routine healing: Secondary | ICD-10-CM

## 2022-04-09 ENCOUNTER — Ambulatory Visit (INDEPENDENT_AMBULATORY_CARE_PROVIDER_SITE_OTHER): Payer: BLUE CROSS/BLUE SHIELD | Admitting: Otolaryngology
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
